# Patient Record
Sex: Male | Born: 1980 | Race: White | Hispanic: No | Marital: Single | Smoking: Never smoker
Health system: Southern US, Community
[De-identification: ages and names within clinical notes are randomized; demographics above are authoritative.]

---

## 2007-08-14 ENCOUNTER — Emergency Department (HOSPITAL_COMMUNITY): Admission: EM | Admit: 2007-08-14 | Discharge: 2007-08-14 | Payer: Self-pay | Admitting: Emergency Medicine
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
## 2020-05-17 ENCOUNTER — Other Ambulatory Visit: Payer: Self-pay

## 2020-05-17 ENCOUNTER — Encounter: Payer: Self-pay | Admitting: Adult Health

## 2020-05-17 ENCOUNTER — Ambulatory Visit (INDEPENDENT_AMBULATORY_CARE_PROVIDER_SITE_OTHER): Payer: 59 | Admitting: Adult Health

## 2020-05-17 VITALS — BP 153/92 | HR 90 | Ht 71.0 in | Wt 270.0 lb

## 2020-05-17 DIAGNOSIS — F909 Attention-deficit hyperactivity disorder, unspecified type: Secondary | ICD-10-CM | POA: Insufficient documentation
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
## 2023-06-19 NOTE — Therapy (Signed)
OUTPATIENT PHYSICAL THERAPY THORACOLUMBAR EVALUATION   Patient Name: Renold Suite MRN: [id number] DOB:1980-11-28, 42 y.o., male Today's Date: 06/20/2023  END OF SESSION:  PT End of Session - 06/20/23 1103     Visit Number 1    Date for PT Re-Evaluation 08/02/23    Authorization Type [employer]    Authorization Time Period requested 12 visits 06/21/23 to 08/02/23    PT Start Time 1103    PT Stop Time 1139    PT Time Calculation (min) 36 min    Activity Tolerance Patient tolerated treatment well    Behavior During Therapy [facility] for tasks assessed/performed             History reviewed. No pertinent past medical history. History reviewed. No pertinent surgical history. Patient Active Problem List   Diagnosis Date Noted   Attention deficit hyperactivity disorder (ADHD) 05/17/2020    PCP: Patient, No Pcp Per   REFERRING PROVIDER: Noni Saupe, MD   REFERRING DIAG: [phone] (ICD-10-CM) - Sciatica, left side   Rationale for Evaluation and Treatment: Rehabilitation  THERAPY DIAG:  Radiculopathy, lumbar region  Muscle weakness (generalized)  Cramp and spasm  ONSET DATE: 3-4 weeks  SUBJECTIVE:                                                                                                                                                                                           SUBJECTIVE STATEMENT: Started feeling pain in his left hip to knee about a month ago. No known MOI. Naproxen helps, He had back pain prior to the leg. Relieved with Aleve. Sits without pain. Standing and walking are worse. Holds onto railing for stairs. Lives in the basement.   PERTINENT HISTORY:  ADHD   PAIN:  Are you having pain? Yes: NPRS scale: 5/10 Pain location: left lateal and post thigh Pain description: sharp Aggravating factors: standing and walking, bending, twisting his leg Relieving factors: naproxen, sitting, lyiing  PRECAUTIONS: None  RED  FLAGS: None   WEIGHT BEARING RESTRICTIONS: No  FALLS:  Has patient fallen in last 6 months? No  LIVING ENVIRONMENT: Lives with: lives with their family Lives in: House/apartment Stairs: Yes: Internal: 13 steps; can reach both   OCCUPATION: unemployed, sits most of the day on the couch.   PLOF: Independent  PATIENT GOALS: get rid of pain  NEXT MD VISIT: none scheduled  OBJECTIVE:   DIAGNOSTIC FINDINGS:  None   PATIENT SURVEYS:  Modified Oswestry 6 / 50 = 12.0 %    COGNITION: Overall cognitive status: Within functional limits for tasks assessed     SENSATION: [facility]  MUSCLE  LENGTH: tight hip rotators, gluteals and quads  POSTURE: rounded shoulders, forward head, increased lumbar lordosis, and right shift  PALPATION: R lumbar and QL Pain with R UPA mobs  LUMBAR ROM: WNL except  AROM eval  Flexion   Extension   Right lateral flexion   Left lateral flexion 50%  Right rotation   Left rotation    (Blank rows = not tested)  LOWER EXTREMITY ROM:   WFL, tight hip rotators, gluteals and quads    Right eval Left eval  Hip flexion    Hip extension    Hip abduction    Hip adduction    Hip internal rotation    Hip external rotation    Knee flexion    Knee extension    Ankle dorsiflexion    Ankle plantarflexion    Ankle inversion    Ankle eversion     (Blank rows = not tested)  LOWER EXTREMITY MMT:    MMT Right eval Left eval  Hip flexion 5 5  Hip extension 4+ 4+  Hip abduction 5 4+  Hip adduction    Hip internal rotation    Hip external rotation    Knee flexion 5 5  Knee extension 5 4+  Ankle dorsiflexion 5 5  Ankle plantarflexion    Ankle inversion    Ankle eversion     (Blank rows = not tested)  LUMBAR SPECIAL TESTS:  Straight leg raise test: Positive and Slump test: Positive   FUNCTIONAL TESTS:  5 times sit to stand: 16 sec    TODAY'S TREATMENT:                                                                                                                               DATE:  06/20/23 EVAL ONLY  See pt ed and HEP   PATIENT EDUCATION:  Education details: PT eval findings, anticipated POC, initial HEP, and posture and body mechanics for typical daily postioning, mobility and household tasks  Person educated: Patient Education method: Explanation, Demonstration, Handouts, and h/o via email Education comprehension: verbalized understanding and returned demonstration  HOME EXERCISE PROGRAM: Access Code: [email or web address] URL: [email or web address] Date: 06/20/2023 Prepared by: Raynelle Fanning  Exercises - Seated Sciatic Tensioner  - 2 x daily - 7 x weekly - 1 sets - 10 reps - 5 second hold - Seated Piriformis Stretch with Trunk Bend  - 2 x daily - 7 x weekly - 1 sets - 3 reps - 30-60 sec  hold  ASSESSMENT:  CLINICAL IMPRESSION: Patient is a 42 y.o. male who was seen today for physical therapy evaluation and treatment for left sciatica starting 3-4 weeks ago. He reports that he had back pain just prior to the leg pain starting, but he no longer feels back pain. He has tightness and pain in his right lumbar limiting left SB. His pain is worse with standing, walking and bending. He is sedentary and spends most of his day sitting  on a couch. He has positive sciatic nerve tension and weakness in his L LE. He will benefit from skilled PT to address these deficits.   OBJECTIVE IMPAIRMENTS: decreased activity tolerance, difficulty walking, decreased ROM, decreased strength, hypomobility, increased muscle spasms, impaired flexibility, improper body mechanics, postural dysfunction, obesity, and pain.   ACTIVITY LIMITATIONS: bending, standing, stairs, dressing, and locomotion level  PARTICIPATION LIMITATIONS:  N/A  PERSONAL FACTORS: Fitness and Transportation are also affecting patient's functional outcome.   REHAB POTENTIAL: Excellent  CLINICAL DECISION MAKING: Stable/uncomplicated  EVALUATION COMPLEXITY:  Low   GOALS: Goals reviewed with patient? Yes  SHORT TERM GOALS: Target date: 07/12/2023   Patient will be independent with initial HEP.  Baseline: no HEP Goal status: INITIAL  2.  Patient will report centralization of radicular symptoms.  Baseline: L upper leg pain Goal status: INITIAL   LONG TERM GOALS: Target date: 08/02/2023   Patient will be independent with advanced/ongoing HEP to improve outcomes and carryover.  Baseline: initial HEP Goal status: INITIAL  2.  Patient will report > = 75% improvement in pain with standing and walking to improve QOL.  Baseline: 5/10 Goal status: INITIAL  3.  Patient will demonstrate improved strength to 5/5 to normalize gait on stairs. Baseline: see objective chart above Goal status: INITIAL  4.  Patient will report 0/50 on Modified Oswestry to demonstrate improved functional ability.  Baseline: 6 / 50 = 12.0 %  Goal status: INITIAL    PLAN:  PT FREQUENCY: 2x/week  PT DURATION: 6 weeks  PLANNED INTERVENTIONS: Therapeutic exercises, Therapeutic activity, Neuromuscular re-education, Gait training, Patient/Family education, Self Care, Joint mobilization, Stair training, Dry Needling, Electrical stimulation, Spinal mobilization, Cryotherapy, Moist heat, Traction, and Manual therapy.  PLAN FOR NEXT SESSION: core, ext biased TE and LE strength, education on body mechanics and ADL modifications, R lumbar release/stretch   Solon Palm, PT  06/20/2023, 1:05 PM

## 2023-06-20 ENCOUNTER — Ambulatory Visit: Payer: MEDICAID | Attending: Family Medicine | Admitting: Physical Therapy

## 2023-06-20 ENCOUNTER — Other Ambulatory Visit: Payer: Self-pay

## 2023-06-20 ENCOUNTER — Encounter: Payer: Self-pay | Admitting: Physical Therapy

## 2023-06-20 DIAGNOSIS — M6281 Muscle weakness (generalized): Secondary | ICD-10-CM | POA: Insufficient documentation

## 2023-06-20 DIAGNOSIS — M5416 Radiculopathy, lumbar region: Secondary | ICD-10-CM | POA: Diagnosis not present

## 2023-06-20 DIAGNOSIS — R252 Cramp and spasm: Secondary | ICD-10-CM | POA: Diagnosis present

## 2023-06-21 NOTE — Addendum Note (Signed)
Addended by: Gearlean Alf on: 06/21/2023 08:11 AM   Modules accepted: Orders

## 2023-07-04 NOTE — Therapy (Signed)
OUTPATIENT PHYSICAL THERAPY THORACOLUMBAR TREATMENT   Patient Name: Gregory Washington MRN: [id number] DOB:[birth year], 42 y.o., male Today's Date: 07/05/2023  END OF SESSION:  PT End of Session - 07/05/23 1016     Visit Number 2    Date for PT Re-Evaluation 08/02/23    Authorization Type [employer]    Authorization Time Period requested 12 visits 06/21/23 to 08/02/23    PT Start Time 1015    PT Stop Time 1057    PT Time Calculation (min) 42 min    Activity Tolerance Patient tolerated treatment well    Behavior During Therapy [facility] for tasks assessed/performed              History reviewed. No pertinent past medical history. History reviewed. No pertinent surgical history. Patient Active Problem List   Diagnosis Date Noted   Attention deficit hyperactivity disorder (ADHD) 05/17/2020    PCP: Patient, No Pcp Per   REFERRING PROVIDER: Noni Saupe, MD   REFERRING DIAG: [phone] (ICD-10-CM) - Sciatica, left side   Rationale for Evaluation and Treatment: Rehabilitation  THERAPY DIAG:  Radiculopathy, lumbar region  Muscle weakness (generalized)  Cramp and spasm  ONSET DATE: 3-4 weeks  SUBJECTIVE:                                                                                                                                                                                           SUBJECTIVE STATEMENT: Patient reports he hasn't had any pain the past 2 days. He hasn't done his exercises. Hasn't taken Naproxen in 4-5 days  PERTINENT HISTORY:  ADHD   PAIN:  Are you having pain? Yes: NPRS scale: 0/10 Pain location: left lateal and post thigh Pain description: sharp Aggravating factors: standing and walking, bending, twisting his leg Relieving factors: naproxen, sitting, lyiing  PRECAUTIONS: None  RED FLAGS: None   WEIGHT BEARING RESTRICTIONS: No  FALLS:  Has patient fallen in last 6 months? No  LIVING ENVIRONMENT: Lives with: lives with their  family Lives in: House/apartment Stairs: Yes: Internal: 13 steps; can reach both   OCCUPATION: unemployed, sits most of the day on the couch.   PLOF: Independent  PATIENT GOALS: get rid of pain  NEXT MD VISIT: none scheduled  OBJECTIVE:   DIAGNOSTIC FINDINGS:  None   PATIENT SURVEYS:  Modified Oswestry 6 / 50 = 12.0 %    COGNITION: Overall cognitive status: Within functional limits for tasks assessed     SENSATION: WFL  MUSCLE LENGTH: tight hip rotators, gluteals and quads  POSTURE: rounded shoulders, forward head, increased lumbar lordosis, and right shift  PALPATION: R lumbar and  QL Pain with R UPA mobs  LUMBAR ROM: WNL except  AROM eval  Flexion   Extension   Right lateral flexion   Left lateral flexion 50%  Right rotation   Left rotation    (Blank rows = not tested)  LOWER EXTREMITY ROM:   WFL, tight hip rotators, gluteals and quads    Right eval Left eval  Hip flexion    Hip extension    Hip abduction    Hip adduction    Hip internal rotation    Hip external rotation    Knee flexion    Knee extension    Ankle dorsiflexion    Ankle plantarflexion    Ankle inversion    Ankle eversion     (Blank rows = not tested)  LOWER EXTREMITY MMT:    MMT Right eval Left eval  Hip flexion 5 5  Hip extension 4+ 4+  Hip abduction 5 4+  Hip adduction    Hip internal rotation    Hip external rotation    Knee flexion 5 5  Knee extension 5 4+  Ankle dorsiflexion 5 5  Ankle plantarflexion    Ankle inversion    Ankle eversion     (Blank rows = not tested)  LUMBAR SPECIAL TESTS:  Straight leg raise test: Positive and Slump test: Positive   FUNCTIONAL TESTS:  5 times sit to stand: 16 sec    TODAY'S TREATMENT:                                                                                                                              DATE:  07/05/23 Nustep Seated fig 4 2x30 sec B Seated sciatic nerve tensioner x 10 L side Prone press ups x  10 Prone hip ext 5 sec hold  x 10 B Prone upper back ext with hands at side x 10 Functional squat x 5, then with 10# goblet squat x 10 Dead lift x 5, then with 10# x 10 Rows 15# 2x10  Lat pull 40# x 10 standing Blue Tband rows x 8, shoulder ext x 8 (high pull), horizontal ABD x 10   06/20/23 EVAL ONLY  See pt ed and HEP   PATIENT EDUCATION:  Education details: PT eval findings, anticipated POC, initial HEP, and posture and body mechanics for typical daily postioning, mobility and household tasks  Person educated: Patient Education method: Explanation, Demonstration, Handouts, and h/o via email Education comprehension: verbalized understanding and returned demonstration  HOME EXERCISE PROGRAM: Access Code: [email or web address] URL: [email or web address] Date: 07/05/2023 Prepared by: Raynelle Fanning  Exercises - Seated Sciatic Tensioner  - 2 x daily - 7 x weekly - 1 sets - 10 reps - 5 second hold - Seated Piriformis Stretch with Trunk Bend  - 2 x daily - 7 x weekly - 1 sets - 3 reps - 30-60 sec  hold - Prone Press Up  - 2 x daily - 7 x weekly - 2 sets -  10 reps - Prone Hip Extension  - 1 x daily - 3-4 x weekly - 2 sets - 10 reps - Standing Shoulder Row with Anchored Resistance  - 1 x daily - 4 x weekly - 1-3 sets - 10 reps - Shoulder extension with resistance - Neutral  - 1 x daily - 4 x weekly - 1-3 sets - 10 reps - Standing Shoulder Horizontal Abduction with Resistance  - 1 x daily - 4 x weekly - 1-3 sets - 10 reps  ASSESSMENT:  CLINICAL IMPRESSION: [name] presents with no pain in the left LE x 2 days. He denies back pain as well. He tolerated all strengthening without complaint of pain, but did feel pull in L LE with nerve tensioner. He requires continual cues for correct form with dead lifts to keep shoulders retracted. HEP progressed with postural and back strengthening. [name] continues to demonstrate potential for improvement and would benefit from continued skilled therapy to address  impairments.     EVAL:Patient is a 42 y.o. male who was seen today for physical therapy evaluation and treatment for left sciatica starting 3-4 weeks ago. He reports that he had back pain just prior to the leg pain starting, but he no longer feels back pain. He has tightness and pain in his right lumbar limiting left SB. His pain is worse with standing, walking and bending. He is sedentary and spends most of his day sitting on a couch. He has positive sciatic nerve tension and weakness in his L LE. He will benefit from skilled PT to address these deficits.   OBJECTIVE IMPAIRMENTS: decreased activity tolerance, difficulty walking, decreased ROM, decreased strength, hypomobility, increased muscle spasms, impaired flexibility, improper body mechanics, postural dysfunction, obesity, and pain.   ACTIVITY LIMITATIONS: bending, standing, stairs, dressing, and locomotion level  PARTICIPATION LIMITATIONS:  N/A  PERSONAL FACTORS: Fitness and Transportation are also affecting patient's functional outcome.   REHAB POTENTIAL: Excellent  CLINICAL DECISION MAKING: Stable/uncomplicated  EVALUATION COMPLEXITY: Low   GOALS: Goals reviewed with patient? Yes  SHORT TERM GOALS: Target date: 07/12/2023   Patient will be independent with initial HEP.  Baseline: no HEP Goal status: INITIAL  2.  Patient will report centralization of radicular symptoms.  Baseline: L upper leg pain Goal status: INITIAL   LONG TERM GOALS: Target date: 08/02/2023   Patient will be independent with advanced/ongoing HEP to improve outcomes and carryover.  Baseline: initial HEP Goal status: INITIAL  2.  Patient will report > = 75% improvement in pain with standing and walking to improve QOL.  Baseline: 5/10 Goal status: INITIAL  3.  Patient will demonstrate improved strength to 5/5 to normalize gait on stairs. Baseline: see objective chart above Goal status: INITIAL  4.  Patient will report 0/50 on Modified Oswestry  to demonstrate improved functional ability.  Baseline: 6 / 50 = 12.0 %  Goal status: INITIAL    PLAN:  PT FREQUENCY: 2x/week  PT DURATION: 6 weeks  PLANNED INTERVENTIONS: Therapeutic exercises, Therapeutic activity, Neuromuscular re-education, Gait training, Patient/Family education, Self Care, Joint mobilization, Stair training, Dry Needling, Electrical stimulation, Spinal mobilization, Cryotherapy, Moist heat, Traction, and Manual therapy.  PLAN FOR NEXT SESSION: core, ext biased TE and LE strength, education on body mechanics and ADL modifications, R lumbar release/stretch   Solon Palm, PT  07/05/2023, 11:03 AM

## 2023-07-05 ENCOUNTER — Ambulatory Visit: Payer: MEDICAID | Attending: Family Medicine | Admitting: Physical Therapy

## 2023-07-05 ENCOUNTER — Encounter: Payer: Self-pay | Admitting: Physical Therapy

## 2023-07-05 DIAGNOSIS — M6281 Muscle weakness (generalized): Secondary | ICD-10-CM | POA: Diagnosis present

## 2023-07-05 DIAGNOSIS — M5416 Radiculopathy, lumbar region: Secondary | ICD-10-CM | POA: Insufficient documentation

## 2023-07-05 DIAGNOSIS — R252 Cramp and spasm: Secondary | ICD-10-CM | POA: Diagnosis present

## 2023-07-06 NOTE — Therapy (Addendum)
 OUTPATIENT PHYSICAL THERAPY THORACOLUMBAR TREATMENT AND DISCHARGE SUMMARY   Patient Name: Gregory Washington MRN: [id number] DOB:Apr 16, 1981, 41 y.o., male Today's Date: 07/09/2023  END OF SESSION:  PT End of Session - 07/09/23 1017     Visit Number 3    Date for PT Re-Evaluation 08/02/23    Authorization Type [employer]    Authorization Time Period requested 12 visits 06/21/23 to 08/02/23    PT Start Time 1017    PT Stop Time 1047    PT Time Calculation (min) 30 min    Activity Tolerance Patient tolerated treatment well    Behavior During Therapy [facility] for tasks assessed/performed               History reviewed. No pertinent past medical history. History reviewed. No pertinent surgical history. Patient Active Problem List   Diagnosis Date Noted   Attention deficit hyperactivity disorder (ADHD) 05/17/2020    PCP: Patient, No Pcp Per   REFERRING PROVIDER: Noni Saupe, MD   REFERRING DIAG: [phone] (ICD-10-CM) - Sciatica, left side   Rationale for Evaluation and Treatment: Rehabilitation  THERAPY DIAG:  Radiculopathy, lumbar region  Muscle weakness (generalized)  Cramp and spasm  ONSET DATE: 3-4 weeks  SUBJECTIVE:                                                                                                                                                                                           SUBJECTIVE STATEMENT: No pain   PERTINENT HISTORY:  ADHD   PAIN:  Are you having pain? Yes: NPRS scale: 0/10 Pain location: left lateal and post thigh Pain description: sharp Aggravating factors: standing and walking, bending, twisting his leg Relieving factors: naproxen, sitting, lyiing  PRECAUTIONS: None  RED FLAGS: None   WEIGHT BEARING RESTRICTIONS: No  FALLS:  Has patient fallen in last 6 months? No  LIVING ENVIRONMENT: Lives with: lives with their family Lives in: House/apartment Stairs: Yes: Internal: 13 steps; can reach  both   OCCUPATION: unemployed, sits most of the day on the couch.   PLOF: Independent  PATIENT GOALS: get rid of pain  NEXT MD VISIT: none scheduled  OBJECTIVE:   DIAGNOSTIC FINDINGS:  None   PATIENT SURVEYS:  Modified Oswestry 6 / 50 = 12.0 %  07/09/23 0/50   COGNITION: Overall cognitive status: Within functional limits for tasks assessed     SENSATION: WFL  MUSCLE LENGTH: tight hip rotators, gluteals and quads  POSTURE: rounded shoulders, forward head, increased lumbar lordosis, and right shift  PALPATION: R lumbar and QL Pain with R UPA mobs  LUMBAR ROM: WNL except  AROM  eval  Flexion   Extension   Right lateral flexion   Left lateral flexion 50%  Right rotation   Left rotation    (Blank rows = not tested)  LOWER EXTREMITY ROM:   WFL, tight hip rotators, gluteals and quads    Right eval Left eval  Hip flexion    Hip extension    Hip abduction    Hip adduction    Hip internal rotation    Hip external rotation    Knee flexion    Knee extension    Ankle dorsiflexion    Ankle plantarflexion    Ankle inversion    Ankle eversion     (Blank rows = not tested)  LOWER EXTREMITY MMT:    MMT Right eval Left eval Left 07/09/23  Hip flexion 5 5   Hip extension 4+ 4+ 5 (R also 5/5)  Hip abduction 5 4+ 5  Hip adduction     Hip internal rotation     Hip external rotation     Knee flexion 5 5   Knee extension 5 4+ 5  Ankle dorsiflexion 5 5   Ankle plantarflexion     Ankle inversion     Ankle eversion      (Blank rows = not tested)  LUMBAR SPECIAL TESTS:  Straight leg raise test: Positive and Slump test: Positive   FUNCTIONAL TESTS:  5 times sit to stand: 16 sec    TODAY'S TREATMENT:                                                                                                                              DATE:   07/09/23 Nustep L5 x 6 min Seated fig 4 1x30 sec B Prone press ups x 5 Prone hip ext 5 sec hold  x 10 B Standing  extension x 10 Therapeutic Activities: ADL modifications and posture worksheet reviewed  07/05/23 Nustep Seated fig 4 2x30 sec B Seated sciatic nerve tensioner x 10 L side Prone press ups x 10 Prone hip ext 5 sec hold  x 10 B Prone upper back ext with hands at side x 10 Functional squat x 5, then with 10# goblet squat x 10 Dead lift x 5, then with 10# x 10 Rows 15# 2x10  Lat pull 40# x 10 standing Blue Tband rows x 8, shoulder ext x 8 (high pull), horizontal ABD x 10   06/20/23 EVAL ONLY  See pt ed and HEP   PATIENT EDUCATION:  Education details: PT eval findings, anticipated POC, initial HEP, and posture and body mechanics for typical daily postioning, mobility and household tasks  Person educated: Patient Education method: Explanation, Demonstration, Handouts, and h/o via email Education comprehension: verbalized understanding and returned demonstration  HOME EXERCISE PROGRAM: Access Code: [email or web address] URL: [email or web address] Date: 07/09/2023 Prepared by: Raynelle Fanning  Exercises - Seated Sciatic Tensioner  - 2 x daily - 7 x weekly - 1 sets - 10 reps - 5  second hold - Seated Piriformis Stretch with Trunk Bend  - 2 x daily - 7 x weekly - 1 sets - 3 reps - 30-60 sec  hold - Prone Press Up  - 2 x daily - 7 x weekly - 2 sets - 10 reps - Prone Hip Extension  - 1 x daily - 3-4 x weekly - 2 sets - 10 reps - Standing Shoulder Row with Anchored Resistance  - 1 x daily - 4 x weekly - 1-3 sets - 10 reps - Shoulder extension with resistance - Neutral  - 1 x daily - 4 x weekly - 1-3 sets - 10 reps - Standing Shoulder Horizontal Abduction with Resistance  - 1 x daily - 4 x weekly - 1-3 sets - 10 reps - Standing Lumbar Extension  - 1 x daily - 7 x weekly - 1 sets - 10 reps  Patient Education - Posture and Body Mechanics  ASSESSMENT:  CLINICAL IMPRESSION: Gregory Washington presents today still reporting no back or leg pain. He is non-compliant with HEP and when asked, he reports that he is  not likely to do his HEP. We reviewed ADL modifications and body mechanics to prevent further injury to the back. PT stressed importance of not sitting all day and encouraged lumbar extension whenever possible. He has met all of his LTGs and is comfortable being placed on hold for 30 days in case he has a flare up. After this hold he will be discharged if he does not return.    EVAL:Patient is a 42 y.o. male who was seen today for physical therapy evaluation and treatment for left sciatica starting 3-4 weeks ago. He reports that he had back pain just prior to the leg pain starting, but he no longer feels back pain. He has tightness and pain in his right lumbar limiting left SB. His pain is worse with standing, walking and bending. He is sedentary and spends most of his day sitting on a couch. He has positive sciatic nerve tension and weakness in his L LE. He will benefit from skilled PT to address these deficits.   OBJECTIVE IMPAIRMENTS: decreased activity tolerance, difficulty walking, decreased ROM, decreased strength, hypomobility, increased muscle spasms, impaired flexibility, improper body mechanics, postural dysfunction, obesity, and pain.   ACTIVITY LIMITATIONS: bending, standing, stairs, dressing, and locomotion level  PARTICIPATION LIMITATIONS:  N/A  PERSONAL FACTORS: Fitness and Transportation are also affecting patient's functional outcome.   REHAB POTENTIAL: Excellent  CLINICAL DECISION MAKING: Stable/uncomplicated  EVALUATION COMPLEXITY: Low   GOALS: Goals reviewed with patient? Yes  SHORT TERM GOALS: Target date: 07/12/2023   Patient will be independent with initial HEP.  Baseline: no HEP Goal status: MET  2.  Patient will report centralization of radicular symptoms.  Baseline: L upper leg pain Goal status: MET   LONG TERM GOALS: Target date: 08/02/2023   Patient will be independent with advanced/ongoing HEP to improve outcomes and carryover.  Baseline: initial  HEP Goal status: MET  2.  Patient will report > = 75% improvement in pain with standing and walking to improve QOL.  Baseline: 5/10 Goal status: MET  3.  Patient will demonstrate improved strength to 5/5 to normalize gait on stairs. Baseline: see objective chart above Goal status: MET  4.  Patient will report 0/50 on Modified Oswestry to demonstrate improved functional ability.  Baseline: 6 / 50 = 12.0 %  Goal status: MET    PLAN:  PT FREQUENCY: 2x/week  PT DURATION: 6 weeks  PLANNED INTERVENTIONS: Therapeutic exercises, Therapeutic activity, Neuromuscular re-education, Gait training, Patient/Family education, Self Care, Joint mobilization, Stair training, Dry Needling, Electrical stimulation, Spinal mobilization, Cryotherapy, Moist heat, Traction, and Manual therapy.  PLAN FOR NEXT SESSION: 30 day hold; D/C after 10/16 if pt does not return.   Solon Palm, PT  07/09/2023, 10:57 AM  PHYSICAL THERAPY DISCHARGE SUMMARY  Visits from Start of Care: 3  Current functional level related to goals / functional outcomes: See above   Remaining deficits: See above   Education / Equipment: HEP   Patient agrees to discharge. Patient goals were met. Patient is being discharged due to meeting the stated rehab goals.  Solon Palm, PT 01/09/24 1:13 PM [facility] [address], [address] [place], [place] [address] Phone # [phone] Fax [phone]

## 2023-07-09 ENCOUNTER — Ambulatory Visit: Payer: MEDICAID | Admitting: Physical Therapy

## 2023-07-09 ENCOUNTER — Encounter: Payer: Self-pay | Admitting: Physical Therapy

## 2023-07-09 DIAGNOSIS — M5416 Radiculopathy, lumbar region: Secondary | ICD-10-CM

## 2023-07-09 DIAGNOSIS — R252 Cramp and spasm: Secondary | ICD-10-CM

## 2023-07-09 DIAGNOSIS — M6281 Muscle weakness (generalized): Secondary | ICD-10-CM

## 2023-07-12 ENCOUNTER — Ambulatory Visit: Payer: MEDICAID | Admitting: Physical Therapy

## 2023-07-16 ENCOUNTER — Ambulatory Visit: Payer: MEDICAID | Admitting: Physical Therapy

## 2023-07-19 ENCOUNTER — Encounter: Payer: MEDICAID | Admitting: Physical Therapy

## 2023-07-23 ENCOUNTER — Encounter: Payer: MEDICAID | Admitting: Physical Therapy

## 2023-07-26 ENCOUNTER — Encounter: Payer: MEDICAID | Admitting: Physical Therapy

## 2023-07-30 ENCOUNTER — Encounter: Payer: MEDICAID | Admitting: Physical Therapy

## 2023-08-02 ENCOUNTER — Encounter: Payer: MEDICAID | Admitting: Physical Therapy
